# Patient Record
Sex: Female | Born: 1960 | Race: Black or African American | Hispanic: No | Marital: Single | Smoking: Never smoker
Health system: Southern US, Community
[De-identification: ages and names within clinical notes are randomized; demographics above are authoritative.]

## PROBLEM LIST (undated history)

## (undated) DIAGNOSIS — I10 Essential (primary) hypertension: Secondary | ICD-10-CM

## (undated) HISTORY — PX: ABDOMINAL HYSTERECTOMY: SHX81

## (undated) HISTORY — PX: BREAST REDUCTION SURGERY: SHX8

---

## 2017-05-30 ENCOUNTER — Emergency Department (HOSPITAL_BASED_OUTPATIENT_CLINIC_OR_DEPARTMENT_OTHER): Payer: BLUE CROSS/BLUE SHIELD

## 2017-05-30 ENCOUNTER — Other Ambulatory Visit: Payer: Self-pay

## 2017-05-30 ENCOUNTER — Encounter (HOSPITAL_BASED_OUTPATIENT_CLINIC_OR_DEPARTMENT_OTHER): Payer: Self-pay | Admitting: Emergency Medicine

## 2017-05-30 ENCOUNTER — Emergency Department (HOSPITAL_BASED_OUTPATIENT_CLINIC_OR_DEPARTMENT_OTHER)
Admission: EM | Admit: 2017-05-30 | Discharge: 2017-05-30 | Disposition: A | Discharge status: Home / Self Care | Payer: BLUE CROSS/BLUE SHIELD | Attending: Emergency Medicine | Admitting: Emergency Medicine

## 2017-05-30 DIAGNOSIS — I10 Essential (primary) hypertension: Secondary | ICD-10-CM | POA: Diagnosis present

## 2017-05-30 DIAGNOSIS — I160 Hypertensive urgency: Secondary | ICD-10-CM | POA: Diagnosis not present

## 2017-05-30 DIAGNOSIS — R0789 Other chest pain: Secondary | ICD-10-CM | POA: Insufficient documentation

## 2017-05-30 DIAGNOSIS — R918 Other nonspecific abnormal finding of lung field: Secondary | ICD-10-CM

## 2017-05-30 DIAGNOSIS — M542 Cervicalgia: Secondary | ICD-10-CM | POA: Insufficient documentation

## 2017-05-30 HISTORY — DX: Essential (primary) hypertension: I10

## 2017-05-30 LAB — URINALYSIS, ROUTINE W REFLEX MICROSCOPIC
Bilirubin Urine: NEGATIVE
GLUCOSE, UA: NEGATIVE mg/dL
Hgb urine dipstick: NEGATIVE
Ketones, ur: NEGATIVE mg/dL
LEUKOCYTES UA: NEGATIVE
NITRITE: NEGATIVE
PROTEIN: NEGATIVE mg/dL
Specific Gravity, Urine: 1.015 (ref 1.005–1.030)
pH: 6 (ref 5.0–8.0)

## 2017-05-30 LAB — CBC WITH DIFFERENTIAL/PLATELET
BASOS ABS: 0 10*3/uL (ref 0.0–0.1)
BASOS PCT: 0 %
EOS PCT: 2 %
Eosinophils Absolute: 0.1 10*3/uL (ref 0.0–0.7)
HCT: 36.4 % (ref 36.0–46.0)
Hemoglobin: 12.5 g/dL (ref 12.0–15.0)
Lymphocytes Relative: 40 %
Lymphs Abs: 2.4 10*3/uL (ref 0.7–4.0)
MCH: 30.6 pg (ref 26.0–34.0)
MCHC: 34.3 g/dL (ref 30.0–36.0)
MCV: 89.2 fL (ref 78.0–100.0)
Monocytes Absolute: 0.5 10*3/uL (ref 0.1–1.0)
Monocytes Relative: 9 %
NEUTROS ABS: 2.9 10*3/uL (ref 1.7–7.7)
Neutrophils Relative %: 49 %
PLATELETS: 197 10*3/uL (ref 150–400)
RBC: 4.08 MIL/uL (ref 3.87–5.11)
RDW: 11.5 % (ref 11.5–15.5)
WBC: 5.9 10*3/uL (ref 4.0–10.5)

## 2017-05-30 LAB — COMPREHENSIVE METABOLIC PANEL
ALT: 17 U/L (ref 14–54)
ANION GAP: 5 (ref 5–15)
AST: 23 U/L (ref 15–41)
Albumin: 3.8 g/dL (ref 3.5–5.0)
Alkaline Phosphatase: 58 U/L (ref 38–126)
BUN: 15 mg/dL (ref 6–20)
CHLORIDE: 107 mmol/L (ref 101–111)
CO2: 29 mmol/L (ref 22–32)
Calcium: 8.6 mg/dL — ABNORMAL LOW (ref 8.9–10.3)
Creatinine, Ser: 0.66 mg/dL (ref 0.44–1.00)
GFR calc non Af Amer: >60 mL/min (ref >60)
Glucose, Bld: 68 mg/dL (ref 65–99)
Potassium: 3.6 mmol/L (ref 3.5–5.1)
SODIUM: 141 mmol/L (ref 135–145)
Total Bilirubin: 0.7 mg/dL (ref 0.3–1.2)
Total Protein: 7.4 g/dL (ref 6.5–8.1)

## 2017-05-30 LAB — TROPONIN I: Troponin I: <0.03 ng/mL (ref <0.03)

## 2017-05-30 LAB — LIPASE, BLOOD: LIPASE: 25 U/L (ref 11–51)

## 2017-05-30 MED ORDER — AZITHROMYCIN 250 MG PO TABS: ORAL_TABLET | ORAL | 0 refills | Status: AC

## 2017-05-30 MED ORDER — CYCLOBENZAPRINE HCL 10 MG PO TABS
10.0000 mg | ORAL_TABLET | Freq: Once | ORAL | Status: AC
  Administered 2017-05-30: 10 mg via ORAL
  Filled 2017-05-30: qty 1

## 2017-05-30 MED ORDER — LISINOPRIL 10 MG PO TABS: 10.0000 mg | ORAL_TABLET | Freq: Every day | ORAL | 0 refills | Status: AC

## 2017-05-30 MED ORDER — LIDOCAINE-EPINEPHRINE-TETRACAINE (LET) SOLUTION: 3.0000 mL | Freq: Once | NASAL | Status: DC

## 2017-05-30 MED ORDER — LISINOPRIL 10 MG PO TABS
10.0000 mg | ORAL_TABLET | Freq: Once | ORAL | Status: AC
  Administered 2017-05-30: 10 mg via ORAL
  Filled 2017-05-30: qty 1

## 2017-05-30 MED ORDER — ASPIRIN 81 MG PO CHEW
324.0000 mg | CHEWABLE_TABLET | Freq: Once | ORAL | Status: AC
  Administered 2017-05-30: 324 mg via ORAL
  Filled 2017-05-30: qty 4

## 2017-05-30 NOTE — ED Notes (Signed)
Patient transported to X-ray 

## 2017-05-30 NOTE — ED Notes (Signed)
Dr. Donnald GarrePfeiffer notified pt still c/o neck pain 7/10. States she had tylenol pta

## 2017-05-30 NOTE — ED Triage Notes (Signed)
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 Not currently on HTN meds.

## 2017-05-30 NOTE — ED Notes (Signed)
Pt given d/c instructions as per chart. Rx x 2. Verbalizes understanding. No questions. 

## 2017-05-30 NOTE — ED Notes (Signed)
See previous note

## 2017-05-30 NOTE — ED Notes (Signed)
Pt on cardiac monitor and auto VS 

## 2017-05-30 NOTE — ED Notes (Signed)
Alert, NAD, calm, interactive, resps e/u, speaking in clear complete sentences, no dyspnea noted, skin W&D, VSS, BP elevated, mentions some continued R neck discomfort, chest fullness, and light headeness, (denies: pain, HA, sob, nausea, dizziness or visual changes). [facility]

## 2017-05-30 NOTE — ED Provider Notes (Signed)
[facility] EMERGENCY DEPARTMENT Provider Note   CSN: [id number] Arrival date & time: 05/30/17  1509     History   Chief Complaint Chief Complaint  Patient presents with  . Hypertension    HPI Nicole Woodward is a 56 y.o. female.  HPI Patient had history of hypertension and 2 years ago self discontinued medications because she ran out of insurance.  Was on lisinopril only.  She reports when she got her insurance back she was seen in May which was 6 months ago for a physical.  At that time her blood pressure was 120s over 80s.  Her evaluating physician did not restart her on any medications based on her good readings.  Also reports that she had "normal cholesterol studies" during her evaluation in May.  She is a non-smoker.  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  She also reports she had a mild "indigestion sensation in her chest.  No associated shortness of breath, nausea, diaphoresis.  1 of the nurses at the facility took her blood pressure and it was significantly elevated.  At that time she was counseled to come to the emergency department for assessment.  Family history is for older sister with atrial fibrillation and older brother with MI. Past Medical History:  Diagnosis Date  . Hypertension     There are no active problems to display for this patient.   Past Surgical History:  Procedure Laterality Date  . ABDOMINAL HYSTERECTOMY    . BREAST REDUCTION SURGERY      OB History    No data available       Home Medications    Prior to Admission medications   Medication Sig Start Date End Date Taking? Authorizing Provider  azithromycin (ZITHROMAX Z-PAK) 250 MG tablet 2 po day one, then 1 daily x 4 days 05/30/17   Arby BarrettePfeiffer, Deundra Bard, MD  lisinopril (PRINIVIL,ZESTRIL) 10 MG tablet Take 1 tablet (10 mg total) daily by mouth. 05/30/17   Arby BarrettePfeiffer, Haruki Arnold, MD    Family  History No family history on file.  Social History Social History   Tobacco Use  . Smoking status: Never Smoker  . Smokeless tobacco: Never Used  Substance Use Topics  . Alcohol use: Yes    Comment: weekly  . Drug use: No     Allergies   Penicillins   Review of Systems Review of Systems 10 Systems reviewed and are negative for acute change except as noted in the HPI.   Physical Exam Updated Vital Signs BP (!) 171/101   Pulse 70   Temp 98.1 F (36.7 C) (Oral)   Resp 20   Ht 5\' 4"  (1.626 m)   Wt 82.6 kg (182 lb)   SpO2 100%   BMI 31.24 kg/m   Physical Exam  Constitutional: She is oriented to person, place, and time. She appears well-developed and well-nourished. No distress.  HENT:  Head: Normocephalic and atraumatic.  Mouth/Throat: Oropharynx is clear and moist.  Eyes: Conjunctivae and EOM are normal. Pupils are equal, round, and reactive to light.  Neck: Neck supple.  Cardiovascular: Normal rate and regular rhythm.  No murmur heard. Pulmonary/Chest: Effort normal and breath sounds normal. No respiratory distress.  Abdominal: Soft. There is no tenderness.  Musculoskeletal: Normal range of motion. She exhibits no edema or tenderness.  Neurological: She is alert and oriented to person, place, and time. No cranial  nerve deficit. She exhibits normal muscle tone. Coordination normal.  Skin: Skin is warm and dry.  Psychiatric: She has a normal mood and affect.  Nursing note and vitals reviewed.    ED Treatments / Results  Labs (all labs ordered are listed, but only abnormal results are displayed) Labs Reviewed  COMPREHENSIVE METABOLIC PANEL - Abnormal; Notable for the following components:      Result Value   Calcium 8.6 (*)    All other components within normal limits  LIPASE, BLOOD  TROPONIN I  CBC WITH DIFFERENTIAL/PLATELET  URINALYSIS, ROUTINE W REFLEX MICROSCOPIC  TROPONIN I    EKG  EKG Interpretation None       Radiology Dg Chest 2  View  Result Date: 05/30/2017 CLINICAL DATA:  Pain and right-sided neck.  Mild chest discomfort. EXAM: CHEST  2 VIEW COMPARISON:  None. FINDINGS: The heart size is borderline. The hila and mediastinum are normal. No pneumothorax. The left lung is clear. Mild opacity in the medial right lung base and perhaps the right upper lobe. No other acute abnormalities. IMPRESSION: Mild opacity in the medial right lung base could represent atelectasis, early infiltrate, or vascular crowding. Minimal opacity not excluded in the right upper lobe. Recommend follow-up to resolution. Electronically Signed   By: Gerome Samavid  Williams III M.D   On: 05/30/2017 17:20    Procedures Procedures (including critical care time)  Medications Ordered in ED Medications  aspirin chewable tablet 324 mg (324 mg Oral Given 05/30/17 1658)  lisinopril (PRINIVIL,ZESTRIL) tablet 10 mg (10 mg Oral Given 05/30/17 1658)  cyclobenzaprine (FLEXERIL) tablet 10 mg (10 mg Oral Given 05/30/17 1844)     Initial Impression / Assessment and Plan / ED Course  I have reviewed the triage vital signs and the nursing notes.  Pertinent labs & imaging results that were available during my care of the patient were reviewed by me and considered in my medical decision making (see chart for details).     Final Clinical Impressions(s) / ED Diagnoses   Final diagnoses:  Hypertensive urgency  Pulmonary infiltrates  Patient is clinically well in appearance.  She developed some pains today on the right side of her neck and a feeling of "indigestion".  She checked her blood pressure at work and it was elevated.  Patient did have prior history of hypertension but apparently had resolved about half a year ago.  At this time, 2 sets of cardiac enzymes are negative.  Chest x-ray shows areas suspicious for infiltrate.  With the patient having a combination of atypical symptoms and positive chest x-ray will opt to treat with Zithromax.  She is counseled on  necessity to initiate lisinopril daily with close follow-up with her PCP for blood pressure monitoring and as well follow-up on the anomaly identified on the chest x-ray.  ED Discharge Orders        Ordered    azithromycin (ZITHROMAX Z-PAK) 250 MG tablet     05/30/17 2006    lisinopril (PRINIVIL,ZESTRIL) 10 MG tablet  Daily     05/30/17 Kaylyn Lim2006       Jenevieve Kirschbaum, MD 05/30/17 2010

## 2017-05-30 NOTE — Discharge Instructions (Signed)
1.  Your chest x-ray shows some areas suspicious for early pneumonia.  Take Zithromax as prescribed.  Follow-up with your doctor next week.  These x-rays will need to be monitored and rechecked for resolution of findings after treatment. 2.  Start lisinopril as prescribed for hypertension. 3.  Return if you have any worsening or changing symptoms.

## 2017-05-30 NOTE — ED Notes (Signed)
Pt reports dx with HTN and on meds 2 years ago. Took herself off meds due to no insurance. Saw PCP in May of this year and BP was normal so provider did not want her to start back on medications at that time. Today states she was having pain in side of her neck and feeling "lightheaded". Denies chest pain

## 2019-07-10 IMAGING — CR DG CHEST 2V
2 series · 2 of 2 positions shown · non-contrast
Comparison: None.

CLINICAL DATA: Pain and right-sided neck.  Mild chest discomfort.

EXAM:
CHEST  2 VIEW

[w chest pa]
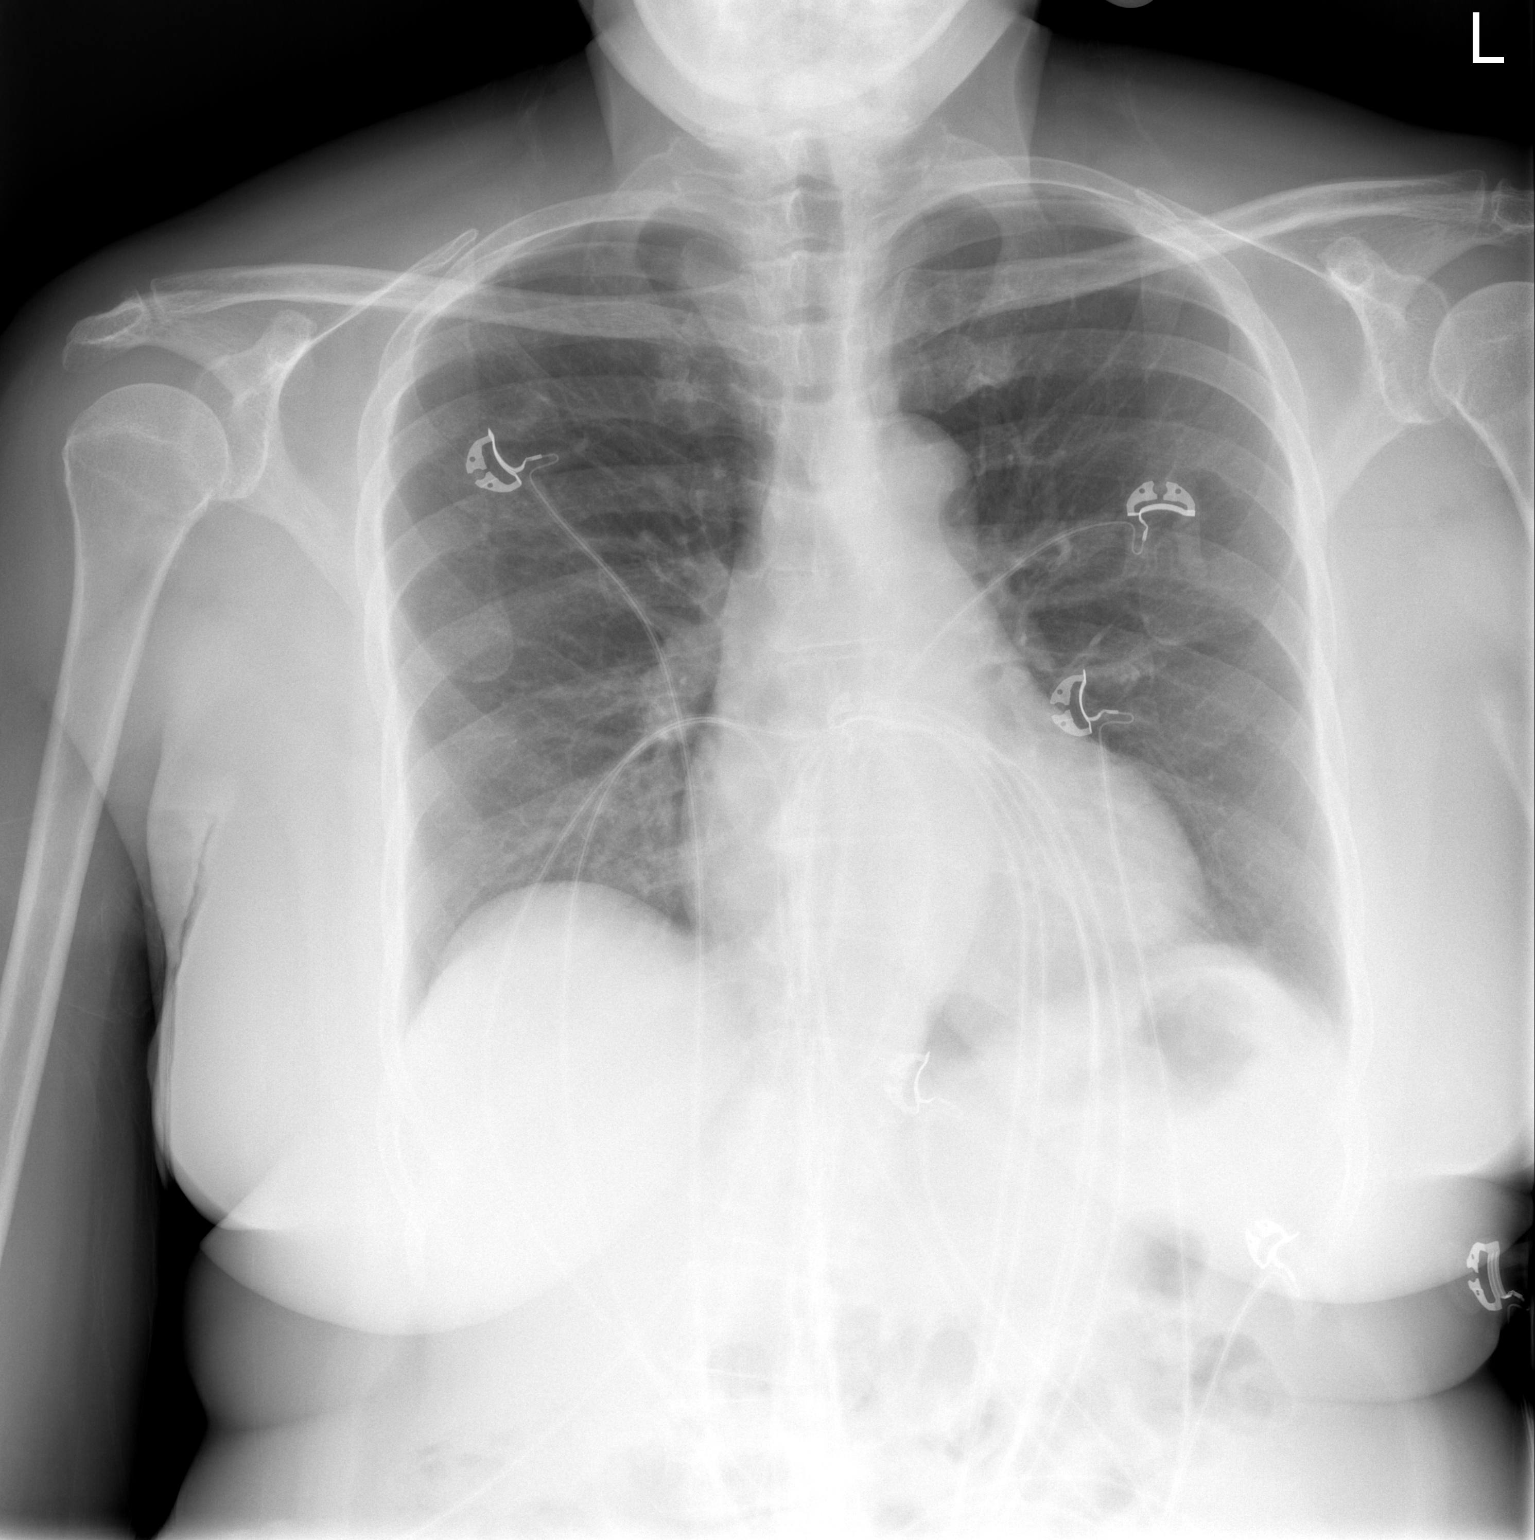

[w chest lat]
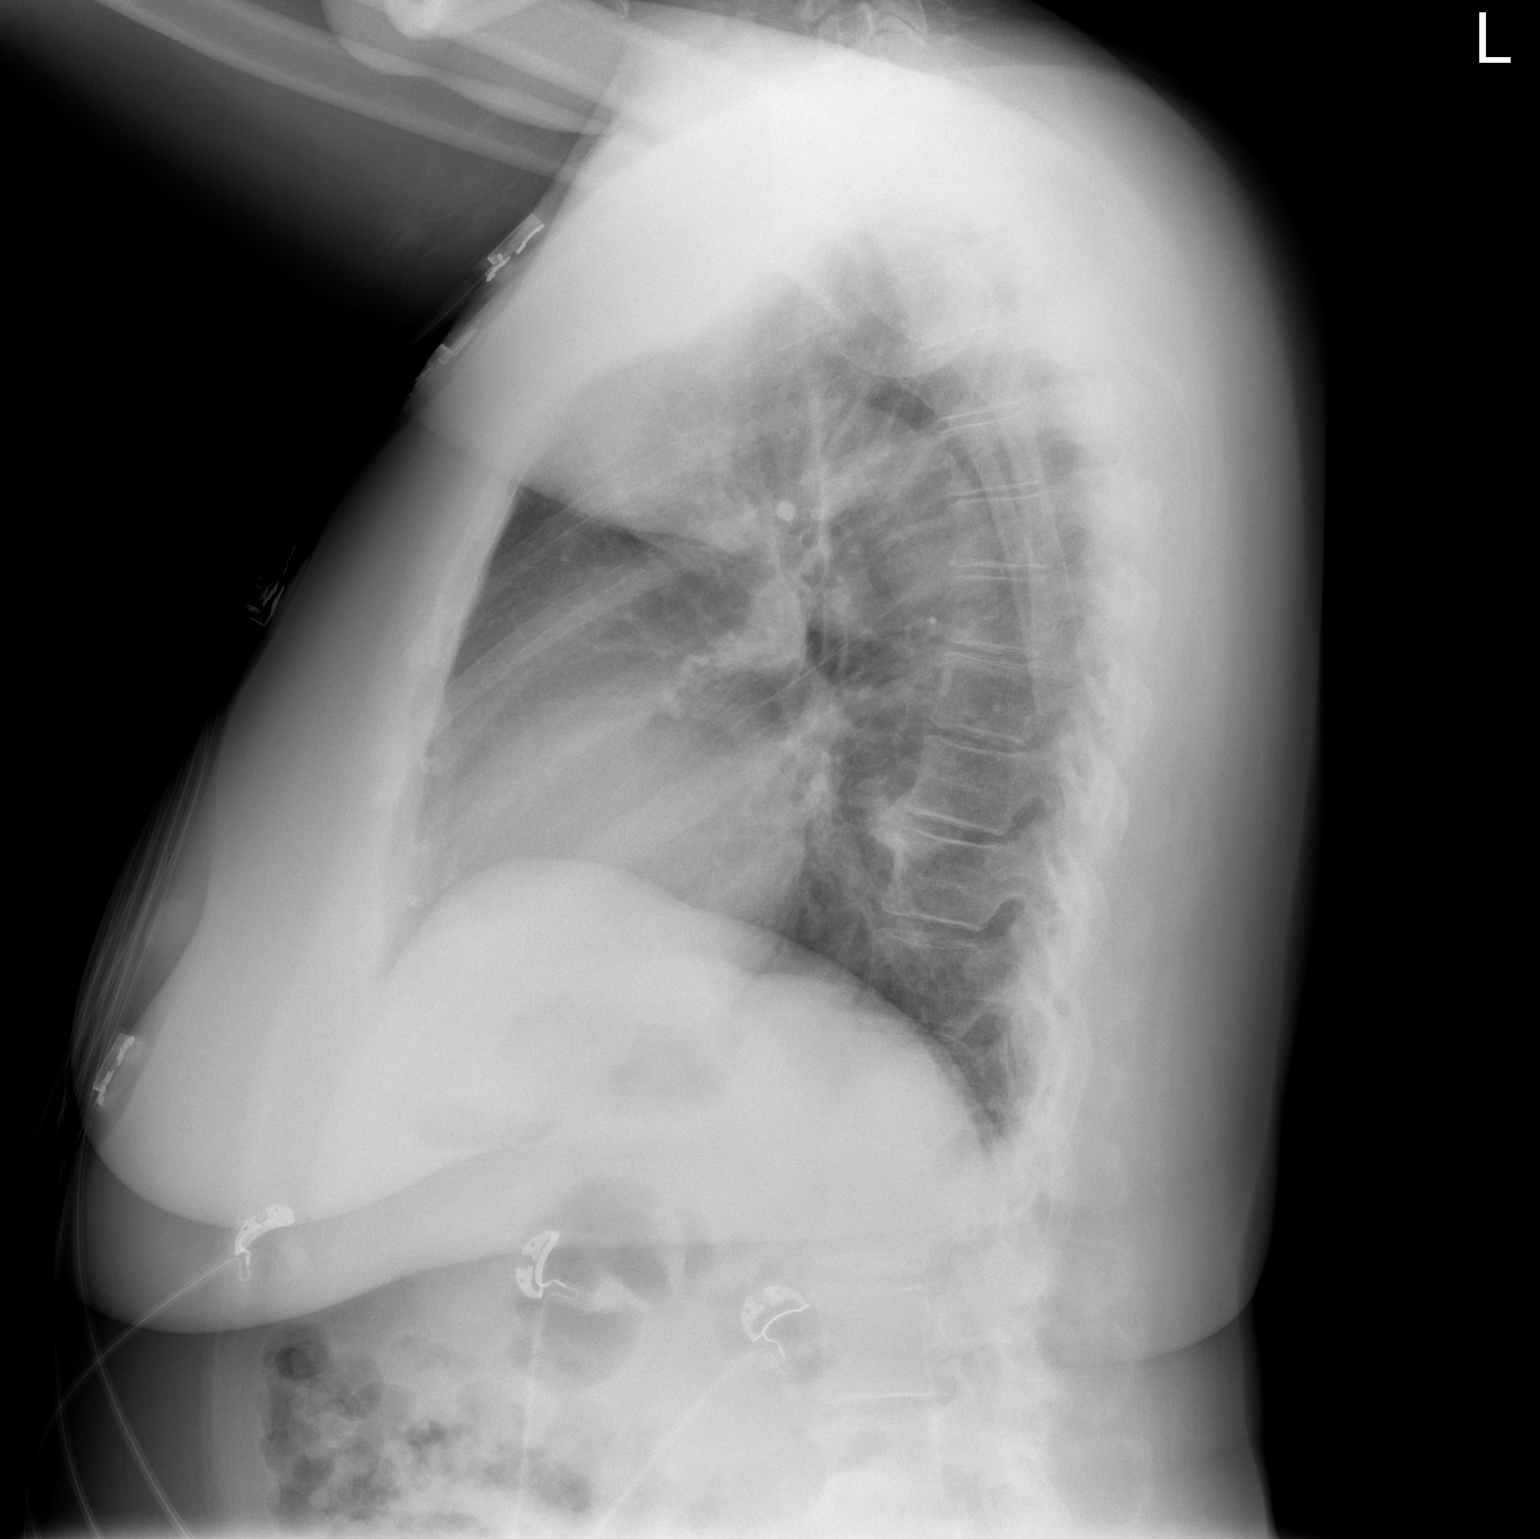

[2 of 2 positions shown; findings below may reference images not displayed]

FINDINGS: The heart size is borderline. The hila and mediastinum are normal.
No pneumothorax. The left lung is clear. Mild opacity in the medial
right lung base and perhaps the right upper lobe. No other acute
abnormalities.
IMPRESSION: Mild opacity in the medial right lung base could represent
atelectasis, early infiltrate, or vascular crowding. Minimal opacity
not excluded in the right upper lobe. Recommend follow-up to
resolution.
